# Patient Record
Sex: Female | Born: 1970 | Race: Black or African American | Hispanic: No | Smoking: Never smoker
Health system: Southern US, Community
[De-identification: ages and names within clinical notes are randomized; demographics above are authoritative.]

## PROBLEM LIST (undated history)

## (undated) DIAGNOSIS — F4310 Post-traumatic stress disorder, unspecified: Secondary | ICD-10-CM

## (undated) DIAGNOSIS — F601 Schizoid personality disorder: Secondary | ICD-10-CM

## (undated) DIAGNOSIS — K219 Gastro-esophageal reflux disease without esophagitis: Secondary | ICD-10-CM

## (undated) DIAGNOSIS — D649 Anemia, unspecified: Secondary | ICD-10-CM

## (undated) DIAGNOSIS — I10 Essential (primary) hypertension: Secondary | ICD-10-CM

## (undated) HISTORY — PX: CHOLECYSTECTOMY: SHX55

## (undated) HISTORY — DX: Anemia, unspecified: D64.9

## (undated) HISTORY — PX: ESOPHAGOGASTRODUODENOSCOPY: SHX1529

## (undated) HISTORY — DX: Essential (primary) hypertension: I10

## (undated) HISTORY — DX: Schizoid personality disorder: F60.1

## (undated) HISTORY — DX: Gastro-esophageal reflux disease without esophagitis: K21.9

## (undated) HISTORY — PX: DENTAL SURGERY: SHX609

---

## 2000-10-07 ENCOUNTER — Encounter: Payer: Self-pay | Admitting: Emergency Medicine

## 2000-10-07 ENCOUNTER — Inpatient Hospital Stay (HOSPITAL_COMMUNITY): Admission: EM | Admit: 2000-10-07 | Discharge: 2000-10-08 | Payer: Self-pay | Admitting: Emergency Medicine

## 2000-10-07 ENCOUNTER — Encounter: Payer: Self-pay | Admitting: *Deleted

## 2000-10-08 ENCOUNTER — Encounter: Payer: Self-pay | Admitting: Otolaryngology

## 2000-10-08 ENCOUNTER — Inpatient Hospital Stay (HOSPITAL_COMMUNITY): Admission: EM | Admit: 2000-10-08 | Discharge: 2000-10-10 | Payer: Self-pay | Admitting: Emergency Medicine

## 2000-10-09 ENCOUNTER — Encounter: Payer: Self-pay | Admitting: Otolaryngology

## 2000-10-23 ENCOUNTER — Emergency Department (HOSPITAL_COMMUNITY): Admission: EM | Admit: 2000-10-23 | Discharge: 2000-10-23 | Payer: Self-pay | Admitting: Emergency Medicine

## 2001-06-16 ENCOUNTER — Emergency Department (HOSPITAL_COMMUNITY): Admission: EM | Admit: 2001-06-16 | Discharge: 2001-06-16 | Payer: Self-pay | Admitting: Emergency Medicine

## 2001-06-16 ENCOUNTER — Encounter: Payer: Self-pay | Admitting: Emergency Medicine

## 2001-06-17 ENCOUNTER — Ambulatory Visit (HOSPITAL_COMMUNITY): Admission: RE | Admit: 2001-06-17 | Discharge: 2001-06-17 | Payer: Self-pay | Admitting: Emergency Medicine

## 2001-06-17 ENCOUNTER — Encounter: Payer: Self-pay | Admitting: Emergency Medicine

## 2001-06-18 ENCOUNTER — Emergency Department (HOSPITAL_COMMUNITY): Admission: EM | Admit: 2001-06-18 | Discharge: 2001-06-19 | Payer: Self-pay | Admitting: *Deleted

## 2002-03-14 ENCOUNTER — Encounter: Payer: Self-pay | Admitting: Family Medicine

## 2002-03-14 ENCOUNTER — Ambulatory Visit (HOSPITAL_COMMUNITY): Admission: RE | Admit: 2002-03-14 | Discharge: 2002-03-14 | Payer: Self-pay | Admitting: Family Medicine

## 2002-04-04 ENCOUNTER — Encounter: Payer: Self-pay | Admitting: General Surgery

## 2002-04-04 ENCOUNTER — Encounter (HOSPITAL_COMMUNITY): Admission: RE | Admit: 2002-04-04 | Discharge: 2002-05-04 | Payer: Self-pay | Admitting: General Surgery

## 2002-04-16 ENCOUNTER — Ambulatory Visit (HOSPITAL_COMMUNITY): Admission: RE | Admit: 2002-04-16 | Discharge: 2002-04-16 | Payer: Self-pay | Admitting: General Surgery

## 2003-05-29 ENCOUNTER — Ambulatory Visit (HOSPITAL_COMMUNITY): Admission: RE | Admit: 2003-05-29 | Discharge: 2003-05-29 | Payer: Self-pay | Admitting: Family Medicine

## 2003-10-16 ENCOUNTER — Ambulatory Visit (HOSPITAL_COMMUNITY): Admission: RE | Admit: 2003-10-16 | Discharge: 2003-10-16 | Payer: Self-pay | Admitting: Family Medicine

## 2005-02-10 ENCOUNTER — Emergency Department (HOSPITAL_COMMUNITY): Admission: EM | Admit: 2005-02-10 | Discharge: 2005-02-10 | Payer: Self-pay | Admitting: Emergency Medicine

## 2005-08-30 ENCOUNTER — Emergency Department (HOSPITAL_COMMUNITY): Admission: EM | Admit: 2005-08-30 | Discharge: 2005-08-30 | Payer: Self-pay | Admitting: Emergency Medicine

## 2005-12-13 ENCOUNTER — Emergency Department (HOSPITAL_COMMUNITY): Admission: EM | Admit: 2005-12-13 | Discharge: 2005-12-13 | Payer: Self-pay | Admitting: Emergency Medicine

## 2006-09-12 ENCOUNTER — Emergency Department (HOSPITAL_COMMUNITY): Admission: EM | Admit: 2006-09-12 | Discharge: 2006-09-12 | Payer: Self-pay | Admitting: Emergency Medicine

## 2006-11-08 ENCOUNTER — Ambulatory Visit (HOSPITAL_COMMUNITY): Admission: RE | Admit: 2006-11-08 | Discharge: 2006-11-08 | Payer: Self-pay | Admitting: Internal Medicine

## 2008-11-11 ENCOUNTER — Encounter (INDEPENDENT_AMBULATORY_CARE_PROVIDER_SITE_OTHER): Payer: Self-pay | Admitting: *Deleted

## 2010-08-05 NOTE — Op Note (Signed)
NAME:  Madison Gilbert, Madison Gilbert                        ACCOUNT NO.:  [id number]   MEDICAL RECORD NO.:  [id number]                   PATIENT TYPE:  AMB   LOCATION:  DAY                                  FACILITY:  [facility]   PHYSICIAN:  Dalia Heading, M.D.               DATE OF BIRTH:  May 13, 1970   DATE OF PROCEDURE:  04/16/2002  DATE OF DISCHARGE:                                 OPERATIVE REPORT   PREOPERATIVE DIAGNOSIS:  Chronic cholecystitis.   POSTOPERATIVE DIAGNOSIS:  Chronic cholecystitis.   PROCEDURE:  Laparoscopic cholecystectomy.   SURGEON:  Dalia Heading, M.D.   ASSISTANT:  Bernerd Limbo. Leona Carry, M.D.   ANESTHESIA:  General endotracheal.   INDICATIONS:  The patient is a 40 year old black female who was referred for  evaluation and treatment of chronic cholecystitis.  The risks and benefits  of the procedure, including bleeding, infection, hepatobiliary injury, and  the possibility of an open procedure were fully explained to the patient,  who gave informed consent.   DESCRIPTION OF PROCEDURE:  The patient was placed in the supine position.  After induction of general endotracheal anesthesia, the abdomen was prepped  and draped using the usual sterile technique with Betadine.  Surgical site  confirmation was performed.   A supraumbilical incision was made down to the fascia.  A Veress needle was  introduced into the abdominal cavity and confirmation of placement was done  using the saline drop test.  The abdomen was then insufflated to 16 mmHg  pressure.  An 11-mm trocar was introduced into the abdominal cavity under  direct visualization without difficulty.  The patient was placed in reversed  Trendelenburg position and an additional 11-mm trocar was placed in the  epigastric region and 5-mm trocars were placed in the right upper quadrant  and right flank regions.  The liver was inspected and noted to be within  normal limits.  The gallbladder was retracted superiorly and  laterally.  The  dissection was begun around the infundibulum of the gallbladder.  The cystic  duct was first identified.  Its junction to the infundibulum was well  identified.  Endo clips were placed proximally and distally on the cystic  duct and the cystic duct was divided.  This was likewise done on the cystic  artery.  The gallbladder was then freed away from the gallbladder fossa  using Bovie electrocautery.  The gallbladder was delivered through the  epigastric trocar site without difficulty.  The gallbladder fossa was  inspected and no abnormal bleeding or bile leakage was noted.  Surgicel was  placed on the gallbladder fossa.  The subhepatic space as well as the right  hepatic gutter were evacuated of all fluid and air prior to removal of the  trocars.   All wounds were irrigated with normal saline.  All wounds were injected with  0.5% Sensorcaine.  The supraumbilical fascia was reapproximated using an 0  Vicryl interrupted suture.  All skin incisions were closed using staples.  Betadine ointment and dry sterile dressings were applied.   All instrument and needle counts were correct at the end of the procedure.  The patient was extubated in the operating room and went back to the  recovery room awake and in stable condition.   COMPLICATIONS:  None.   SPECIMEN:  Gallbladder.   BLOOD LOSS:  Minimal.                                                Dalia Heading, M.D.    MAJ/MEDQ  D:  04/16/2002  T:  04/16/2002  Job:  [id number]   cc:   Patrica Duel, M.D.  [address]  [place]  [place] [address]  Fax: [phone]

## 2010-08-05 NOTE — Consult Note (Signed)
[facility]  Patient:    Madison Gilbert, Madison Gilbert                      MRN: [id number] Proc. Date: 10/08/00 Adm. Date:  [id number] Attending:  Fernande Boyden CC:         Gloris Manchester. Lazarus Salines, M.D.  Virl Diamond, D.M.D., M.D.   Consultation Report  DATE OF BIRTH:  [birth year]  REQUESTING PHYSICIAN:  Gloris Manchester. Lazarus Salines, M.D.  Madison Gilbert is a 40 year old female referred by Dr. Flo Shanks for a dental consultation.  Patient presented to [facility] Emergency Room for evaluation of right facial swelling on October 07, 2000.  Patient was then transported to [facility] Emergency Department on October 08, 2000 for evaluation by an ear, nose, and throat physician (Dr. Lazarus Salines) for possible Ludwigs angina.  A CT scan was obtained with no obvious formation noted.  Patient was then placed on clindamycin IV antibiotic therapy and was to be admitted for overnight observation.  Dental consultation was requested for evaluation of the patient and assistance for definitive treatment coordination.  PAST MEDICAL HISTORY: 1. Right submandibular space cellulitis.    a. Current IV antibiotic therapy with Dr. Lazarus Salines. 2. Gross dental caries of the low right molar. 3. History of previous extractions by an oral surgeon in 1991.  ALLERGIES:  NYQUIL, MOTRIN.  MEDICATIONS: 1. Clindamycin 900 mg STAT, then 600 mg IV q.8h. 2. Tylenol 650 mg q.4h. for temperature greater than 101. 3. Tylox one to two q.4h. p.r.n. for severe pain. 4. Tylenol No. 3 one to two q.4h. p.r.n. for pain. 5. Chlorhexidine rinse 0.12% rinsing with 5 ml q.4h.  SOCIAL HISTORY:  Patient is a nonsmoker, nondrinker.  FAMILY HISTORY:  Noncontributory.  FUNCTIONAL ASSESSMENT:  Patient was independent for ADLs prior to this admission.  REVIEW OF SYSTEMS:  This is reviewed from the chart/health history assessment form this admission.  DENTAL HISTORY  CHIEF COMPLAINT:  Patient complained of lower  right facial swelling.  HISTORY OF PRESENT ILLNESS:  Patient with a history of lower right tooth ache which has progressively worsened over the past month.  Patient subsequently developed right submandibular swelling with difficulties swallowing, opening and closing, and chewing.  Patient presented to the [facility] Emergency Room, was transferred to the [facility] Emergency Department for evaluation by ear, nose, and throat physician for possible Ludwigs angina.  CT scan was obtained with no obvious abscess formation noted.  Patient was felt to have a right submandibular space cellulitis secondary to dental caries affecting tooth #31.  Patient complained of dental pain associated with a lower right molar (tooth #31).  Pain is described as being 9.5 out of 10 in pain intensity.  Patient with minimal relief with pain medications and prior to the emergency department Demerol.  Patient does not seek regular dental care by patient report.  DENTAL EXAMINATION  GENERAL:  Patient is a well-developed, well-nourished female in no acute distress.  VITAL SIGNS:  Blood pressure 130/63, pulse 90, respirations 22, temperature 99.2.  HEENT:  Patient has right extraoral submandibular swelling involving the right neck and angle of mandible.  There is no fluctuance noted.  The area is firm to palpation.  The patient is sensitive to palpation.  Patient with some symptoms of trismus and a decreased maximum interincisal opening.  Patient currently denies symptoms of dysphagia or difficulty breathing at this time. Intraoral examination:  The patient has normal saliva.  There is swelling and erythema around the root segments of #31 and impacted tooth #32.  There is no obvious purulence noted around these teeth.  The area is tender to palpation by patient report.  Dentition:  Tooth #31 with gross dental caries most likely involving the pulp.  Dental caries are affecting multiple teeth within the oral  cavity.  I would suggest a full series of dental radiographs and comprehensive dental examination by the private dentist of her choice in the future.  Endodontic:  There is a history of acute pulpitis symptoms of tooth #31.  There is a periapical radiolucency noted at the apices of tooth #31. There is a radiolucency also noted around the coronal portion of tooth #32.  ASSESSMENT: 1. Right submandibular space cellulitis. 2. Acute apical periodontitis of tooth #31. 3. Pericoronitis of the impacted tooth #32. 4. Acute, irreversible pulpitis symptoms of tooth #32. 5. Multiple dental caries with a need for followup comprehensive dental care    and full series of dental radiographs.  PLAN/RECOMMENDATIONS: 1. I discussed the risks, benefits, and complications of various treatment    options with the patient in relationship to her medical and dental    conditions. We discussed continuation of antibiotic therapy and the need to    take oral medications until they are all gone once they are prescribed.  We    discussed followup with the oral surgeon for extraction of tooth #31 and 32    due to the complexity of the extractions.  We discussed followup dental    radiographs and examination and treatment plan by the private dentist of    her choice in the future. 2. Discussion of findings with Dr. Lazarus Salines as indicated. 3. Discussion of findings with Dr. Rhea Pink (oral surgeon on-call) for an    outpatient appointment on Thursday.  Patient to present with n.p.o. status    and a driver to drive her home in the event the oral surgeon is able to    extract the teeth at that time. 4. Continue chlorhexidine rinses 0.12% rinsing with 10 ml t.i.d. DD:  10/09/00 TD:  10/09/00 Job: [id number]

## 2010-08-05 NOTE — H&P (Signed)
   NAME:  Madison Gilbert, Madison Gilbert                        ACCOUNT NO.:  [id number]   MEDICAL RECORD NO.:  [id number]                   PATIENT TYPE:   LOCATION:                                       FACILITY:  [facility]   PHYSICIAN:  Dalia Heading, M.D.               DATE OF BIRTH:  02-18-1971   DATE OF ADMISSION:  04/16/2002  DATE OF DISCHARGE:                                HISTORY & PHYSICAL   CHIEF COMPLAINT:  Biliary colic.   HISTORY OF PRESENT ILLNESS:  The patient is a 40 year old black female who  is referred for evaluation and treatment of right upper quadrant abdominal  pain.  She has been having upper abdominal pain with radiation into the  right flank, nausea, bloating, and fatty food intolerance for over 1 year.  No fever, chills, jaundice, or history of peptic ulcer disease have been  noted.   PAST MEDICAL HISTORY:  Extrinsic allergies.   PAST SURGICAL HISTORY:  Tubal ligation.   CURRENT MEDICATIONS:  Ibuprofen, Allegra.   ALLERGIES:  No known drug allergies.   REVIEW OF SYSTEMS:  Noncontributory.   PHYSICAL EXAMINATION:  GENERAL:  On physical examination, the patient is a  well-developed well-nourished black female in no acute distress.  VITAL SIGNS:  She is afebrile and vital signs are stable.  HEENT:  No scleral icterus.  LUNGS:  Clear to auscultation with equal breath sounds bilaterally.  HEART:  Regular rate and rhythm without S3, S4, or murmurs.  ABDOMEN:  Soft with tenderness noted in the right upper quadrant.  No  hepatosplenomegaly, masses, or hernias are identified.   DIAGNOSTIC STUDIES:  Ultrasound of the gallbladder is negative in March  2003.  An upper GI series was negative for peptic ulcer disease in December  2003.  Her hepatobiliary scan showed normal gallbladder ejection fraction  but she did have reproducible symptoms with a fatty meal.   IMPRESSION:  Biliary colic secondary to chronic cholecystitis.    PLAN:  The patient is scheduled for  laparoscopic cholecystectomy on  04/16/02.  The risks and benefits of the procedure including bleeding,  infection, hepatobiliary injury, the possibility of an open procedure, and  the possibility of recurrence of the symptoms were fully explained to the  patient, who gave informed consent.                                               Dalia Heading, M.D.    MAJ/MEDQ  D:  04/08/2002  T:  04/08/2002  Job:  [id number]   cc:   Patrica Duel, M.D.  [address]  [place]  [place] [address]  Fax: [phone]

## 2011-02-22 ENCOUNTER — Encounter: Payer: Self-pay | Admitting: Internal Medicine

## 2011-03-06 ENCOUNTER — Ambulatory Visit: Payer: Self-pay | Admitting: Gastroenterology

## 2011-03-21 HISTORY — PX: COLONOSCOPY: SHX174

## 2011-09-28 ENCOUNTER — Other Ambulatory Visit (HOSPITAL_COMMUNITY): Payer: Self-pay | Admitting: Internal Medicine

## 2011-09-28 DIAGNOSIS — Z139 Encounter for screening, unspecified: Secondary | ICD-10-CM

## 2011-09-28 DIAGNOSIS — Z01419 Encounter for gynecological examination (general) (routine) without abnormal findings: Secondary | ICD-10-CM

## 2011-10-02 ENCOUNTER — Ambulatory Visit (HOSPITAL_COMMUNITY)
Admission: RE | Admit: 2011-10-02 | Discharge: 2011-10-02 | Disposition: A | Discharge status: Home / Self Care | Payer: Medicaid Other | Source: Ambulatory Visit | Attending: Internal Medicine | Admitting: Internal Medicine

## 2011-10-02 DIAGNOSIS — Z139 Encounter for screening, unspecified: Secondary | ICD-10-CM

## 2011-10-02 DIAGNOSIS — Z01419 Encounter for gynecological examination (general) (routine) without abnormal findings: Secondary | ICD-10-CM

## 2011-10-02 DIAGNOSIS — Z1231 Encounter for screening mammogram for malignant neoplasm of breast: Secondary | ICD-10-CM | POA: Insufficient documentation

## 2011-10-03 ENCOUNTER — Telehealth: Payer: Self-pay

## 2011-10-03 NOTE — Telephone Encounter (Signed)
Received referral from Dr. Regino Schultze for pt to have a colonoscopy for rectal bleeding. I called the pt to schedule OV first. She asked for Dr. Karilyn Cota, and said that he has her records, and that is where she told them to send her referral to. I told her I will fax [facility] back a note and she is going to call them also. She was also given Dr.Rehman's phone number.

## 2011-10-03 NOTE — Telephone Encounter (Signed)
Agree 

## 2011-10-11 ENCOUNTER — Encounter (INDEPENDENT_AMBULATORY_CARE_PROVIDER_SITE_OTHER): Payer: Self-pay | Admitting: *Deleted

## 2011-10-19 ENCOUNTER — Ambulatory Visit (INDEPENDENT_AMBULATORY_CARE_PROVIDER_SITE_OTHER): Payer: Medicaid Other | Admitting: Internal Medicine

## 2011-10-19 ENCOUNTER — Encounter (INDEPENDENT_AMBULATORY_CARE_PROVIDER_SITE_OTHER): Payer: Self-pay | Admitting: Internal Medicine

## 2011-10-19 VITALS — BP 130/70 | HR 80 | Temp 98.4°F | Ht 63.0 in | Wt 218.4 lb

## 2011-10-19 DIAGNOSIS — I10 Essential (primary) hypertension: Secondary | ICD-10-CM | POA: Insufficient documentation

## 2011-10-19 DIAGNOSIS — D649 Anemia, unspecified: Secondary | ICD-10-CM | POA: Insufficient documentation

## 2011-10-19 LAB — FERRITIN: Ferritin: 19 ng/mL (ref 10–291)

## 2011-10-19 LAB — IRON AND TIBC: %SAT: 15 % — ABNORMAL LOW (ref 20–55)

## 2011-10-19 NOTE — Progress Notes (Addendum)
Subjective:     Patient ID: Madison Gilbert, female   DOB: 1971/01/02, 41 y.o.   MRN: [id number]  HPI Referred to our office by [facility] for anemia. Noted on 09/28/2011 H nad H 8.0 and 26.5.  MCV 68.8.  She declined to be transfused per [name], PAC.  She also says she has been having bloody stools for years. Symptoms have progressively worsened. She is having bloody stools 2-3 times a day every day. She says its like she is having a period. She started the Iron on September 28, 2011. Blood is dark.  No unintentional weight loss. She c/o epigastric pain.  She says when she passes flatus she will also pass blood. She has a hx of anemia and was actually seen in our [facility] for anemia. Colonoscopy was scheduled but she did not follow thru.  No change in stool size.  LMP:  September 24, 2011 and lasted thru the 23 of July. Very heavy.  Uses 28 pads in 2 weeks and also 36 tampons. She is not sexually active  Review of Systems see hpi Current Outpatient Prescriptions  Medication Sig Dispense Refill  . ALPRAZolam (XANAX) 0.5 MG tablet Take 0.5 mg by mouth at bedtime as needed.      Marland Kitchen amLODipine (NORVASC) 5 MG tablet Take 5 mg by mouth daily.      . ergocalciferol (VITAMIN D2) 50000 UNITS capsule Take 50,000 Units by mouth once a week.      . ferrous sulfate 325 (65 FE) MG tablet Take 325 mg by mouth daily with breakfast.      . traMADol (ULTRAM) 50 MG tablet Take 50 mg by mouth every 6 (six) hours as needed.      . vitamin E 400 UNIT capsule Take 400 Units by mouth daily.          History   Social History  . Marital Status: Widowed    Spouse Name: N/A    Number of Children: N/A  . Years of Education: N/A   Occupational History  . Not on file.   Social History Main Topics  . Smoking status: Never Smoker   . Smokeless tobacco: Not on file  . Alcohol Use: No  . Drug Use: No  . Sexually Active: Not on file   Other Topics Concern  . Not on file   Social History Narrative  . No  narrative on file   Family Status  Relation Status Death Age  . Mother Alive     DM, HTN, COPD, DJD back, CAD  . Father Deceased     suicide  . Sister Alive     DM, One needs a heart transplant  . Brother Alive     good health   Past Surgical History  Procedure Date  . Cholecystectomy     for bloody stools   Allergies  Allergen Reactions  . Hydrocodone   . Lisinopril    Family Status  Relation Status Death Age  . Mother Alive     DM, HTN, COPD, DJD back, CAD  . Father Deceased     suicide  . Sister Alive     DM, One needs a heart transplant  . Brother Alive     good health           Objective:   Physical Exam Filed Vitals:   10/19/11 1106  Height: 5\' 3"  (1.6 m)  Weight: 218 lb 6.4 oz (99.066 kg)  Alert and oriented.  Skin warm and dry. Oral mucosa is moist.   . Sclera anicteric, conjunctivae is pink. Thyroid not enlarged. No cervical lymphadenopathy. Lungs clear. Heart regular rate and rhythm.  Abdomen is soft. Bowel sounds are positive. No hepatomegaly. No abdominal masses felt. No tenderness.  No edema to lower extremities.        Assessment:   Anemia. Rectal bleeding. Colonic neoplasm needs to be ruled.  If normal EGD to rule PUD.  Patient gives hx of heavy menstrual cycle.    Plan:   Colonoscopy if normal EGD.The risks and benefits such as perforation, bleeding, and infection were reviewed with the patient and is agreeable.  Iron studies.

## 2011-10-19 NOTE — Patient Instructions (Addendum)
Colonoscopy, if normal EGD. Iron studies.

## 2011-10-23 ENCOUNTER — Other Ambulatory Visit (INDEPENDENT_AMBULATORY_CARE_PROVIDER_SITE_OTHER): Payer: Self-pay | Admitting: *Deleted

## 2011-10-23 ENCOUNTER — Telehealth (INDEPENDENT_AMBULATORY_CARE_PROVIDER_SITE_OTHER): Payer: Self-pay | Admitting: *Deleted

## 2011-10-23 DIAGNOSIS — K625 Hemorrhage of anus and rectum: Secondary | ICD-10-CM

## 2011-10-23 DIAGNOSIS — D649 Anemia, unspecified: Secondary | ICD-10-CM

## 2011-10-23 DIAGNOSIS — Z1211 Encounter for screening for malignant neoplasm of colon: Secondary | ICD-10-CM

## 2011-10-23 MED ORDER — BISACODYL-PEG-KCL-NABICAR-NACL 5-210 MG-GM PO KIT: 1.0000 | PACK | Freq: Once | ORAL | Status: DC

## 2011-10-23 NOTE — Telephone Encounter (Signed)
Patient needs half lytely 

## 2011-10-24 ENCOUNTER — Encounter (INDEPENDENT_AMBULATORY_CARE_PROVIDER_SITE_OTHER): Payer: Self-pay | Admitting: *Deleted

## 2011-11-01 ENCOUNTER — Encounter (HOSPITAL_COMMUNITY): Payer: Self-pay | Admitting: Pharmacy Technician

## 2011-11-17 ENCOUNTER — Encounter (HOSPITAL_COMMUNITY): Payer: Self-pay

## 2011-11-17 ENCOUNTER — Encounter (HOSPITAL_COMMUNITY)
Admission: RE | Disposition: A | Discharge status: Home / Self Care | Payer: Self-pay | Source: Ambulatory Visit | Attending: Internal Medicine

## 2011-11-17 ENCOUNTER — Ambulatory Visit (HOSPITAL_COMMUNITY)
Admission: RE | Admit: 2011-11-17 | Discharge: 2011-11-17 | Disposition: A | Discharge status: Home / Self Care | Payer: Medicaid Other | Source: Ambulatory Visit | Attending: Internal Medicine | Admitting: Internal Medicine

## 2011-11-17 DIAGNOSIS — K625 Hemorrhage of anus and rectum: Secondary | ICD-10-CM

## 2011-11-17 DIAGNOSIS — I10 Essential (primary) hypertension: Secondary | ICD-10-CM | POA: Insufficient documentation

## 2011-11-17 DIAGNOSIS — D126 Benign neoplasm of colon, unspecified: Secondary | ICD-10-CM

## 2011-11-17 DIAGNOSIS — Z79899 Other long term (current) drug therapy: Secondary | ICD-10-CM | POA: Insufficient documentation

## 2011-11-17 DIAGNOSIS — R109 Unspecified abdominal pain: Secondary | ICD-10-CM

## 2011-11-17 DIAGNOSIS — D509 Iron deficiency anemia, unspecified: Secondary | ICD-10-CM | POA: Insufficient documentation

## 2011-11-17 DIAGNOSIS — K644 Residual hemorrhoidal skin tags: Secondary | ICD-10-CM

## 2011-11-17 DIAGNOSIS — D649 Anemia, unspecified: Secondary | ICD-10-CM

## 2011-11-17 DIAGNOSIS — D131 Benign neoplasm of stomach: Secondary | ICD-10-CM

## 2011-11-17 SURGERY — COLONOSCOPY WITH ESOPHAGOGASTRODUODENOSCOPY (EGD)
Anesthesia: Moderate Sedation

## 2011-11-17 MED ORDER — MEPERIDINE HCL 50 MG/ML IJ SOLN
INTRAMUSCULAR | Status: DC | PRN
  Administered 2011-11-17 (×2): 25 mg via INTRAVENOUS

## 2011-11-17 MED ORDER — MIDAZOLAM HCL 5 MG/5ML IJ SOLN
INTRAMUSCULAR | Status: DC | PRN
  Administered 2011-11-17 (×3): 2 mg via INTRAVENOUS

## 2011-11-17 MED ORDER — MIDAZOLAM HCL 5 MG/5ML IJ SOLN
INTRAMUSCULAR | Status: AC
  Filled 2011-11-17: qty 10

## 2011-11-17 MED ORDER — HYOSCYAMINE SULFATE 0.125 MG SL SUBL: 0.1250 mg | SUBLINGUAL_TABLET | Freq: Four times a day (QID) | SUBLINGUAL | Status: DC | PRN

## 2011-11-17 MED ORDER — MEPERIDINE HCL 50 MG/ML IJ SOLN
INTRAMUSCULAR | Status: AC
  Filled 2011-11-17: qty 1

## 2011-11-17 MED ORDER — STERILE WATER FOR IRRIGATION IR SOLN
Status: DC | PRN
  Administered 2011-11-17: 11:00:00

## 2011-11-17 MED ORDER — SODIUM CHLORIDE 0.45 % IV SOLN
Freq: Once | INTRAVENOUS | Status: AC
  Administered 2011-11-17: 10:00:00 via INTRAVENOUS

## 2011-11-17 MED ORDER — HYDROCORTISONE ACETATE 25 MG RE SUPP: 25.0000 mg | Freq: Every day | RECTAL | Status: AC

## 2011-11-17 MED ORDER — BUTAMBEN-TETRACAINE-BENZOCAINE 2-2-14 % EX AERO
INHALATION_SPRAY | CUTANEOUS | Status: DC | PRN
  Administered 2011-11-17: 2 via TOPICAL

## 2011-11-17 NOTE — Op Note (Signed)
EGD PROCEDURE REPORT  PATIENT:  Madison Gilbert  MR#:  [id number] Birthdate:  April 21, 1970, 41 y.o., female Endoscopist:  Dr. Malissa Hippo, MD Referred By:  Madison Mend, PA Procedure Date: 11/17/2011  Procedure:   EGD & Colonoscopy  Indications:  Patient is 41 year old African female with a recurrent/chronic abdominal pain, rectal bleeding and iron deficiency anemia.            Informed Consent:  The risks, benefits, alternatives & imponderables which include, but are not limited to, bleeding, infection, perforation, drug reaction and potential missed lesion have been reviewed.  The potential for biopsy, lesion removal, esophageal dilation, etc. have also been discussed.  Questions have been answered.  All parties agreeable.  Please see history & physical in medical record for more information.  Medications:  Demerol 50 mg IV Versed 6 mg IV Cetacaine spray topically for oropharyngeal anesthesia  EGD  Description of procedure:  The endoscope was introduced through the mouth and advanced to the second portion of the duodenum without difficulty or limitations. The mucosal surfaces were surveyed very carefully during advancement of the scope and upon withdrawal.  Findings:  Esophagus:  Normal mucosa throughout. GEJ:  38 cm Stomach:  Stomach was empty and distended very well with insufflation. Folds in the proximal stomach were normal. Examination of mucosa at gastric body, antrum, pyloric channel, fundus, cardia was normal. Duodenum:  Normal bulbar mucosa. 10 mm submucosal lesion involving second part of the duodenum either a lipoma or leiomyoma. It was palpated with closed biopsy forceps it did not appear to be a cyst.  Therapeutic/Diagnostic Maneuvers Performed:  None  COLONOSCOPY Description of procedure:  After a digital rectal exam was performed, that colonoscope was advanced from the anus through the rectum and colon to the area of the cecum, ileocecal valve and appendiceal  orifice. The cecum was deeply intubated. These structures were well-seen and photographed for the record. From the level of the cecum and ileocecal valve, the scope was slowly and cautiously withdrawn. The mucosal surfaces were carefully surveyed utilizing scope tip to flexion to facilitate fold flattening as needed. The scope was pulled down into the rectum where a thorough exam including retroflexion was performed. Terminal ileum was also examined.  Findings:  Prep satisfactory.  Normal terminal ileum. 4 mm polyp ablated via cold biopsy from sigmoid colon. Normal rectal mucosa. Large hemorrhoids with erythema below the dentate line.  Therapeutic/Diagnostic Maneuvers Performed:  See above  Complications:  None  Cecal Withdrawal Time:  11 minutes  Impression:  10 mm submucosal lesion involving second part of the duodenum otherwise normal EGD. This lesion is either a lipoma or leiomyoma. Normal terminal ileum. 4 mm  polyp ablated via cold biopsy from sigmoid colon. Prominent external hemorrhoids with inflammation felt to be source of patient's chronic rectal bleeding.  Recommendations:  Resume iron pills as before. High-fiber diet. Anusol-HC suppository 1 per rectum daily at bedtime for two weeks. Avocet S. L1 4 times a day when necessary. I will contact patient with results of biopsy. Office visit in 2 months. Repeat EGD in one year to document stability of duodenal submucosal lesion.   Madison Gilbert  11/17/2011 11:57 AM  CC: Dr. Gracelyn Nurse, PA & Dr. No ref. provider found

## 2011-11-17 NOTE — H&P (Signed)
Madison Gilbert is an 41 y.o. female.   Chief Complaint: Patient is here for EGD and colonoscopy. HPI: This 41 year old African female who presents with chronic abdominal pain intermittent rectal bleeding. She said rectal bleeding for 10 years. At times she feels as if she is having.. She also gives history of heavy periods. She was begun on Provera by Dr. Ralph Dowdy but so far it has not helped. She also complains of frequent abdominal pain. She states she had cholecystectomy in January 2004 her pain has continued. She gives history of peptic ulcer disease. She does not take any NSAIDs. She denies chronic diarrhea and constipation. Her abdominal pain starts around cholecystectomy scar and radiates inferiorly. She had iron studies confirmed iron deficiency anemia.  Past Medical History  Diagnosis Date  . Anemia   . Hypertension     Past Surgical History  Procedure Date  . Cholecystectomy     for bloody stools    History reviewed. No pertinent family history. Social History:  reports that she has never smoked. She does not have any smokeless tobacco history on file. She reports that she does not drink alcohol or use illicit drugs.  Allergies:  Allergies  Allergen Reactions  . Hydrocodone Nausea Only  . Lisinopril Other (See Comments)    Dizziness     Medications Prior to Admission  Medication Sig Dispense Refill  . acetaminophen (TYLENOL) 500 MG tablet Take 1,000 mg by mouth every 6 (six) hours as needed. Pain      . ALPRAZolam (XANAX) 0.5 MG tablet Take 0.5 mg by mouth at bedtime as needed.      Marland Kitchen amLODipine (NORVASC) 5 MG tablet Take 5 mg by mouth daily.      . Bisacodyl-PEG-KCl-NaBicar-NaCl (HALFLYTELY WITH FLAVOR PACKS) 5-210 MG-GM kit Take 1 kit by mouth once.  1 each  0  . Brompheniramine-Phenylephrine (COLD & ALLERGY PO) Take 1 tablet by mouth daily as needed. Allergies      . diphenhydrAMINE (BENADRYL) 25 MG tablet Take 25 mg by mouth every 6 (six) hours as needed. Allergies       . ergocalciferol (VITAMIN D2) 50000 UNITS capsule Take 50,000 Units by mouth once a week. Takes on Thursdays      . traMADol (ULTRAM) 50 MG tablet Take 50 mg by mouth every 6 (six) hours as needed. Pain      . ferrous sulfate 325 (65 FE) MG tablet Take 325 mg by mouth daily with breakfast.      . medroxyPROGESTERone (PROVERA) 5 MG tablet Take 5 mg by mouth daily.        No results found for this or any previous visit (from the past 48 hour(s)). No results found.  ROS  Blood pressure 148/93, pulse 80, temperature 98.1 F (36.7 C), temperature source Oral, resp. rate 22, height 5\' 3"  (1.6 m), weight 219 lb (99.338 kg), SpO2 98.00%. Physical Exam  Constitutional: She appears well-developed and well-nourished.  HENT:  Mouth/Throat: Oropharynx is clear and moist.  Eyes: Conjunctivae are normal.  Neck: No thyromegaly present.  Cardiovascular: Normal rate, regular rhythm and normal heart sounds.   No murmur heard. Respiratory: Effort normal and breath sounds normal.  GI: Soft. She exhibits no distension and no mass. There is no tenderness.  Musculoskeletal: She exhibits no edema.  Lymphadenopathy:    She has no cervical adenopathy.  Neurological: She is alert.  Skin: Skin is warm and dry.     Assessment/Plan Chronic abdominal pain. Iron deficiency anemia. Rectal  bleeding. EGD and colonoscopy.  Yoskar Murrillo U 11/17/2011, 11:07 AM

## 2011-11-24 ENCOUNTER — Encounter (INDEPENDENT_AMBULATORY_CARE_PROVIDER_SITE_OTHER): Payer: Self-pay

## 2011-11-28 ENCOUNTER — Encounter (INDEPENDENT_AMBULATORY_CARE_PROVIDER_SITE_OTHER): Payer: Self-pay | Admitting: *Deleted

## 2012-01-22 ENCOUNTER — Ambulatory Visit (INDEPENDENT_AMBULATORY_CARE_PROVIDER_SITE_OTHER): Payer: Medicaid Other | Admitting: Internal Medicine

## 2012-01-30 ENCOUNTER — Ambulatory Visit (INDEPENDENT_AMBULATORY_CARE_PROVIDER_SITE_OTHER): Payer: Medicaid Other | Admitting: Internal Medicine

## 2012-11-11 ENCOUNTER — Encounter (INDEPENDENT_AMBULATORY_CARE_PROVIDER_SITE_OTHER): Payer: Self-pay | Admitting: *Deleted

## 2016-03-20 HISTORY — PX: ABDOMINAL HYSTERECTOMY: SHX81

## 2017-07-23 ENCOUNTER — Emergency Department (HOSPITAL_COMMUNITY): Admission: EM | Admit: 2017-07-23 | Discharge: 2017-07-23 | Discharge status: Against Medical Advice | Payer: Self-pay

## 2017-07-23 NOTE — ED Notes (Signed)
Called pt to triage x1

## 2017-07-23 NOTE — ED Notes (Signed)
Called no answer

## 2017-07-30 ENCOUNTER — Encounter (HOSPITAL_COMMUNITY): Payer: Self-pay | Admitting: Emergency Medicine

## 2017-07-30 ENCOUNTER — Emergency Department (HOSPITAL_COMMUNITY): Payer: Self-pay

## 2017-07-30 ENCOUNTER — Emergency Department (HOSPITAL_COMMUNITY)
Admission: EM | Admit: 2017-07-30 | Discharge: 2017-07-30 | Disposition: A | Discharge status: Home / Self Care | Payer: Self-pay | Attending: Emergency Medicine | Admitting: Emergency Medicine

## 2017-07-30 DIAGNOSIS — K2100 Gastro-esophageal reflux disease with esophagitis, without bleeding: Secondary | ICD-10-CM

## 2017-07-30 DIAGNOSIS — Z79899 Other long term (current) drug therapy: Secondary | ICD-10-CM | POA: Insufficient documentation

## 2017-07-30 DIAGNOSIS — R079 Chest pain, unspecified: Secondary | ICD-10-CM | POA: Insufficient documentation

## 2017-07-30 DIAGNOSIS — I10 Essential (primary) hypertension: Secondary | ICD-10-CM | POA: Insufficient documentation

## 2017-07-30 DIAGNOSIS — K0889 Other specified disorders of teeth and supporting structures: Secondary | ICD-10-CM | POA: Insufficient documentation

## 2017-07-30 HISTORY — DX: Post-traumatic stress disorder, unspecified: F43.10

## 2017-07-30 LAB — BASIC METABOLIC PANEL
Anion gap: 8 (ref 5–15)
BUN: 10 mg/dL (ref 6–20)
CALCIUM: 9 mg/dL (ref 8.9–10.3)
CO2: 25 mmol/L (ref 22–32)
CREATININE: 1.13 mg/dL — AB (ref 0.44–1.00)
Chloride: 104 mmol/L (ref 101–111)
GFR calc Af Amer: >60 mL/min (ref >60)
GFR calc non Af Amer: 57 mL/min — ABNORMAL LOW (ref >60)
GLUCOSE: 93 mg/dL (ref 65–99)
Potassium: 3.7 mmol/L (ref 3.5–5.1)
SODIUM: 137 mmol/L (ref 135–145)

## 2017-07-30 LAB — CBC
HCT: 37.6 % (ref 36.0–46.0)
HEMOGLOBIN: 11.7 g/dL — AB (ref 12.0–15.0)
MCH: 26.2 pg (ref 26.0–34.0)
MCHC: 31.1 g/dL (ref 30.0–36.0)
MCV: 84.1 fL (ref 78.0–100.0)
Platelets: 184 10*3/uL (ref 150–400)
RBC: 4.47 MIL/uL (ref 3.87–5.11)
RDW: 13.4 % (ref 11.5–15.5)
WBC: 4.7 10*3/uL (ref 4.0–10.5)

## 2017-07-30 LAB — TROPONIN I: TROPONIN I: 0.04 ng/mL — AB (ref <0.03)

## 2017-07-30 MED ORDER — TRAMADOL HCL 50 MG PO TABS: 50.0000 mg | ORAL_TABLET | Freq: Four times a day (QID) | ORAL | 0 refills | Status: DC | PRN

## 2017-07-30 MED ORDER — OMEPRAZOLE 20 MG PO CPDR: 20.0000 mg | DELAYED_RELEASE_CAPSULE | Freq: Two times a day (BID) | ORAL | 1 refills | Status: DC

## 2017-07-30 MED ORDER — AMOXICILLIN 250 MG PO CAPS
500.0000 mg | ORAL_CAPSULE | Freq: Once | ORAL | Status: AC
  Administered 2017-07-30: 500 mg via ORAL
  Filled 2017-07-30: qty 2

## 2017-07-30 MED ORDER — PANTOPRAZOLE SODIUM 40 MG PO TBEC
40.0000 mg | DELAYED_RELEASE_TABLET | Freq: Once | ORAL | Status: AC
  Administered 2017-07-30: 40 mg via ORAL
  Filled 2017-07-30: qty 1

## 2017-07-30 MED ORDER — AMOXICILLIN 500 MG PO CAPS: 500.0000 mg | ORAL_CAPSULE | Freq: Three times a day (TID) | ORAL | 0 refills | Status: DC

## 2017-07-30 MED ORDER — TRAMADOL HCL 50 MG PO TABS
50.0000 mg | ORAL_TABLET | Freq: Once | ORAL | Status: AC
  Administered 2017-07-30: 50 mg via ORAL
  Filled 2017-07-30: qty 1

## 2017-07-30 MED ORDER — AMLODIPINE BESYLATE 10 MG PO TABS: 10.0000 mg | ORAL_TABLET | Freq: Every day | ORAL | 0 refills | Status: DC

## 2017-07-30 NOTE — ED Provider Notes (Signed)
[facility] EMERGENCY DEPARTMENT Provider Note   CSN: [id number] Arrival date & time: 07/30/17  1252     History   Chief Complaint Chief Complaint  Patient presents with  . Chest Pain    HPI Madison Gilbert is a 47 y.o. female.  Chief complaint is tooth pain, chest pain, recent spider bite, homeless, out of medications.  HPI: As above, patient has multiple complaints.  States she is homeless.  She and her fianc been staying in hotels.  They are now staying with her fianc's parents.  Has been out of her amlodipine blood pressure medicine for a few months.  Was bitten by her arm a few days ago by what she believes was a brown recluse.  States it "got all swollen".  It is better now.  Also states she has had some heartburn and has discomfort in her chest when she lays flat.  Has a painful left lower tooth for months that is been worse for the last 2 days.  Has been taking Tylenol.  Does not take ibuprofen because it "tears up my stomach".  However has been taking Aleve.  Past Medical History:  Diagnosis Date  . Anemia   . Hypertension   . PTSD (post-traumatic stress disorder)     Patient Active Problem List   Diagnosis Date Noted  . Anemia 10/19/2011  . Hypertension 10/19/2011    Past Surgical History:  Procedure Laterality Date  . ABDOMINAL HYSTERECTOMY    . CHOLECYSTECTOMY     for bloody stools     OB History   None      Home Medications    Prior to Admission medications   Medication Sig Start Date End Date Taking? Authorizing Provider  acetaminophen (TYLENOL) 500 MG tablet Take 1,000 mg by mouth every 6 (six) hours as needed. Pain   Yes [provider]  amLODipine (NORVASC) 5 MG tablet Take 5 mg by mouth daily.   Yes [provider]  diazepam (VALIUM) 5 MG tablet Take 5 mg by mouth 3 (three) times daily. 05/08/17  Yes [provider]  diphenhydrAMINE (BENADRYL) 25 MG tablet Take 25 mg by mouth daily. Allergies    Yes [provider]  naproxen sodium (ALEVE) 220 MG tablet Take 440 mg by mouth daily as needed.   Yes [provider]  amoxicillin (AMOXIL) 500 MG capsule Take 1 capsule (500 mg total) by mouth 3 (three) times daily. 07/30/17   Tanna Furry, MD  omeprazole (PRILOSEC) 20 MG capsule Take 1 capsule (20 mg total) by mouth 2 (two) times daily. 07/30/17   Tanna Furry, MD  traMADol (ULTRAM) 50 MG tablet Take 1 tablet (50 mg total) by mouth every 6 (six) hours as needed. 07/30/17   Tanna Furry, MD    Family History History reviewed. No pertinent family history.  Social History Social History   Tobacco Use  . Smoking status: Never Smoker  . Smokeless tobacco: Never Used  Substance Use Topics  . Alcohol use: No  . Drug use: No     Allergies   Grass extracts [gramineae pollens]; Hydrocodone; Lisinopril; and Tomato   Review of Systems Review of Systems  Constitutional: Negative for appetite change, chills, diaphoresis, fatigue and fever.  HENT: Positive for dental problem. Negative for mouth sores, sore throat and trouble swallowing.   Eyes: Negative for visual disturbance.  Respiratory: Negative for cough, chest tightness, shortness of breath and wheezing.   Cardiovascular: Positive for chest pain.  Gastrointestinal: Negative  for abdominal distention, abdominal pain, diarrhea, nausea and vomiting.  Endocrine: Negative for polydipsia, polyphagia and polyuria.  Genitourinary: Negative for dysuria, frequency and hematuria.  Musculoskeletal: Negative for gait problem.       Left arm pain from spider bite  Skin: Negative for color change, pallor and rash.  Neurological: Negative for dizziness, syncope, light-headedness and headaches.  Hematological: Does not bruise/bleed easily.  Psychiatric/Behavioral: Negative for behavioral problems and confusion.     Physical Exam Updated Vital Signs BP (!) 204/117 (BP Location: Right Arm)   Pulse 95   Temp 98.5 F (36.9 C) (Oral)   Resp 20    Ht 5\' 3"  (1.6 m)   Wt 98 kg (216 lb)   LMP 09/24/2011   SpO2 96%   BMI 38.26 kg/m   Physical Exam  Constitutional: She is oriented to person, place, and time. She appears well-developed and well-nourished. No distress.  HENT:  Head: Normocephalic.  Multiple fractured carious teeth.  Her left second molar has a cavity.  There is no periodontal swelling.  It is painful to touch.  The first molar, and first premolar are fractured and carious.  No gingival swelling.  No induration to the neck.  Eyes: Pupils are equal, round, and reactive to light. Conjunctivae are normal. No scleral icterus.  Neck: Normal range of motion. Neck supple. No thyromegaly present.  Cardiovascular: Normal rate and regular rhythm. Exam reveals no gallop and no friction rub.  No murmur heard. Pulmonary/Chest: Effort normal and breath sounds normal. No respiratory distress. She has no wheezes. She has no rales.  Clear bilateral breath sounds.  Large lipoma left posterior chest.  Abdominal: Soft. Bowel sounds are normal. She exhibits no distension. There is no tenderness. There is no rebound.  Musculoskeletal: Normal range of motion.  Left arm appears normal.  No necrotic area erythema or swelling to suggest brown recluse bite  Neurological: She is alert and oriented to person, place, and time.  Skin: Skin is warm and dry. No rash noted.  Psychiatric: She has a normal mood and affect. Her behavior is normal.     ED Treatments / Results  Labs (all labs ordered are listed, but only abnormal results are displayed) Labs Reviewed  BASIC METABOLIC PANEL - Abnormal; Notable for the following components:      Result Value   Creatinine, Ser 1.13 (*)    GFR calc non Af Amer 57 (*)    All other components within normal limits  CBC - Abnormal; Notable for the following components:   Hemoglobin 11.7 (*)    All other components within normal limits  TROPONIN I - Abnormal; Notable for the following components:   Troponin I  0.04 (*)    All other components within normal limits    EKG EKG Interpretation  Date/Time:  Monday Jul 30 2017 12:58:05 EDT Ventricular Rate:  97 PR Interval:  150 QRS Duration: 72 QT Interval:  332 QTC Calculation: 421 R Axis:   62 Text Interpretation:  Normal sinus rhythm Normal ECG Confirmed by Tanna Furry [phone]) on 07/30/2017 1:41:36 PM   Radiology Dg Chest 2 View  Result Date: 07/30/2017 CLINICAL DATA:  Chest pain/pressure with shortness of breath EXAM: CHEST - 2 VIEW COMPARISON:  May 09, 2017 FINDINGS: The lungs are clear. The heart size and pulmonary vascularity are normal. No adenopathy. No pneumothorax. No bone lesions. IMPRESSION: No edema or consolidation. Electronically Signed   By: Lowella Grip III M.D.   On: 07/30/2017 13:50  Procedures Procedures (including critical care time)  Medications Ordered in ED Medications  traMADol (ULTRAM) tablet 50 mg (has no administration in time range)  amoxicillin (AMOXIL) capsule 500 mg (has no administration in time range)  pantoprazole (PROTONIX) EC tablet 40 mg (has no administration in time range)     Initial Impression / Assessment and Plan / ED Course  I have reviewed the triage vital signs and the nursing notes.  Pertinent labs & imaging results that were available during my care of the patient were reviewed by me and considered in my medical decision making (see chart for details).    EKG Interpretation  Date/Time:  Monday Jul 30 2017 12:58:05 EDT Ventricular Rate:  97 PR Interval:  150 QRS Duration: 72 QT Interval:  332 QTC Calculation: 421 R Axis:   62 Text Interpretation:  Normal sinus rhythm Normal ECG Confirmed by Tanna Furry [phone]) on 07/30/2017 1:41:36 PM   Normal chest x-ray.  Reassuring labs.  Troponin 0.0 2:04 weeks of continuous chest pain not consistent with myocardial injury.  EKG no acute or ischemic changes, no injury or ectopy.  I think her symptoms are consistent with reflux and  esophagitis secondary to medication use from her tooth.  Given dental referral.  Prilosec, amoxicillin, tramadol.  Avoid NSAIDs.  Final Clinical Impressions(s) / ED Diagnoses   Final diagnoses:  Chest pain, unspecified type  Gastroesophageal reflux disease with esophagitis  Pain, dental    ED Discharge Orders        Ordered    amoxicillin (AMOXIL) 500 MG capsule  3 times daily     07/30/17 1441    traMADol (ULTRAM) 50 MG tablet  Every 6 hours PRN     07/30/17 1441    omeprazole (PRILOSEC) 20 MG capsule  2 times daily     07/30/17 1441       Tanna Furry, MD 07/30/17 1446

## 2017-07-30 NOTE — ED Triage Notes (Signed)
Patient complaining of chest pain x 2 weeks with shortness of breath. Also complaining of pain to bottom left tooth.

## 2017-07-30 NOTE — ED Notes (Signed)
CRITICAL VALUE ALERT  Critical Value:  Troponin 0.04   Date & Time Notied:  07/30/17 1425   Provider Notified: Notified Dr. Jeneen Rinks   Orders Received/Actions taken: No orders yet

## 2017-07-30 NOTE — ED Notes (Signed)
EDP at bedside  

## 2017-07-30 NOTE — Discharge Instructions (Addendum)
Dr. Geralynn Ochs is a dentist in [place].  He can see people based on ability to pay, subsidized by [facility].  Call for appointment.  Tell the staff there you were seen in the ER and referred there.  Call [facility] in [place] for intake appointment.  Also, tell them you are referred from the emergency room.  Ultram or Tylenol for pain.  No other over-the-counter medications.

## 2018-01-22 ENCOUNTER — Emergency Department (HOSPITAL_COMMUNITY)
Admission: EM | Admit: 2018-01-22 | Discharge: 2018-01-22 | Disposition: A | Discharge status: Home / Self Care | Payer: Self-pay | Attending: Emergency Medicine | Admitting: Emergency Medicine

## 2018-01-22 ENCOUNTER — Emergency Department (HOSPITAL_COMMUNITY): Payer: Self-pay

## 2018-01-22 ENCOUNTER — Encounter (HOSPITAL_COMMUNITY): Payer: Self-pay | Admitting: *Deleted

## 2018-01-22 ENCOUNTER — Other Ambulatory Visit: Payer: Self-pay

## 2018-01-22 DIAGNOSIS — S63502A Unspecified sprain of left wrist, initial encounter: Secondary | ICD-10-CM | POA: Insufficient documentation

## 2018-01-22 DIAGNOSIS — S70311A Abrasion, right thigh, initial encounter: Secondary | ICD-10-CM | POA: Insufficient documentation

## 2018-01-22 DIAGNOSIS — Z79899 Other long term (current) drug therapy: Secondary | ICD-10-CM | POA: Insufficient documentation

## 2018-01-22 DIAGNOSIS — Z23 Encounter for immunization: Secondary | ICD-10-CM | POA: Insufficient documentation

## 2018-01-22 DIAGNOSIS — I10 Essential (primary) hypertension: Secondary | ICD-10-CM | POA: Insufficient documentation

## 2018-01-22 DIAGNOSIS — Y939 Activity, unspecified: Secondary | ICD-10-CM | POA: Insufficient documentation

## 2018-01-22 DIAGNOSIS — S7011XA Contusion of right thigh, initial encounter: Secondary | ICD-10-CM | POA: Insufficient documentation

## 2018-01-22 DIAGNOSIS — Y92009 Unspecified place in unspecified non-institutional (private) residence as the place of occurrence of the external cause: Secondary | ICD-10-CM | POA: Insufficient documentation

## 2018-01-22 DIAGNOSIS — Y999 Unspecified external cause status: Secondary | ICD-10-CM | POA: Insufficient documentation

## 2018-01-22 MED ORDER — TETANUS-DIPHTH-ACELL PERTUSSIS 5-2.5-18.5 LF-MCG/0.5 IM SUSP
0.5000 mL | Freq: Once | INTRAMUSCULAR | Status: AC
  Administered 2018-01-22: 0.5 mL via INTRAMUSCULAR
  Filled 2018-01-22: qty 0.5

## 2018-01-22 MED ORDER — METHOCARBAMOL 500 MG PO TABS: ORAL_TABLET | ORAL | 0 refills | Status: DC

## 2018-01-22 MED ORDER — ACETAMINOPHEN 500 MG PO TABS
1000.0000 mg | ORAL_TABLET | Freq: Once | ORAL | Status: AC
  Administered 2018-01-22: 1000 mg via ORAL
  Filled 2018-01-22: qty 2
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
## 2018-01-22 NOTE — Discharge Instructions (Addendum)
Keep the abrasion clean and dry. Do clean it daily with a soapy wash cloth. You can put triple antibiotic ointment on it to prevent infection. Use ice packs on the bruised area. Take acetaminophen 1000 mg 3-4 times a day for pain as needed. You can also try the muscle relaxer for the bruising pain.   Recheck if you get a fever, the wounds gets infected or if you are still painful in your wrist in another week. Call Dr Aline Brochure, the orthopedist on call, to get an appointment.
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
## 2018-01-27 ENCOUNTER — Emergency Department (HOSPITAL_COMMUNITY)
Admission: EM | Admit: 2018-01-27 | Discharge: 2018-01-27 | Disposition: A | Discharge status: Home / Self Care | Payer: Self-pay | Attending: Emergency Medicine | Admitting: Emergency Medicine

## 2018-01-27 ENCOUNTER — Encounter (HOSPITAL_COMMUNITY): Payer: Self-pay | Admitting: Emergency Medicine

## 2018-01-27 DIAGNOSIS — F419 Anxiety disorder, unspecified: Secondary | ICD-10-CM

## 2018-01-27 DIAGNOSIS — Z79899 Other long term (current) drug therapy: Secondary | ICD-10-CM | POA: Insufficient documentation

## 2018-01-27 DIAGNOSIS — F4310 Post-traumatic stress disorder, unspecified: Secondary | ICD-10-CM | POA: Insufficient documentation

## 2018-01-27 DIAGNOSIS — T7411XA Adult physical abuse, confirmed, initial encounter: Secondary | ICD-10-CM | POA: Insufficient documentation

## 2018-01-27 DIAGNOSIS — I10 Essential (primary) hypertension: Secondary | ICD-10-CM | POA: Insufficient documentation

## 2018-01-27 DIAGNOSIS — F329 Major depressive disorder, single episode, unspecified: Secondary | ICD-10-CM | POA: Insufficient documentation

## 2018-01-27 DIAGNOSIS — F32A Depression, unspecified: Secondary | ICD-10-CM

## 2018-01-27 LAB — BASIC METABOLIC PANEL
ANION GAP: 8 (ref 5–15)
BUN: 11 mg/dL (ref 6–20)
CALCIUM: 8.8 mg/dL — AB (ref 8.9–10.3)
CHLORIDE: 104 mmol/L (ref 98–111)
CO2: 25 mmol/L (ref 22–32)
Creatinine, Ser: 0.85 mg/dL (ref 0.44–1.00)
GFR calc Af Amer: >60 mL/min (ref >60)
GFR calc non Af Amer: >60 mL/min (ref >60)
Glucose, Bld: 83 mg/dL (ref 70–99)
POTASSIUM: 3.1 mmol/L — AB (ref 3.5–5.1)
Sodium: 137 mmol/L (ref 135–145)

## 2018-01-27 LAB — CBC WITH DIFFERENTIAL/PLATELET
ABS IMMATURE GRANULOCYTES: 0.01 10*3/uL (ref 0.00–0.07)
Basophils Absolute: 0 10*3/uL (ref 0.0–0.1)
Basophils Relative: 1 %
Eosinophils Absolute: 0.1 10*3/uL (ref 0.0–0.5)
Eosinophils Relative: 1 %
HCT: 37.6 % (ref 36.0–46.0)
HEMOGLOBIN: 11.5 g/dL — AB (ref 12.0–15.0)
Immature Granulocytes: 0 %
LYMPHS ABS: 1.1 10*3/uL (ref 0.7–4.0)
LYMPHS PCT: 27 %
MCH: 26.2 pg (ref 26.0–34.0)
MCHC: 30.6 g/dL (ref 30.0–36.0)
MCV: 85.6 fL (ref 80.0–100.0)
MONOS PCT: 9 %
Monocytes Absolute: 0.3 10*3/uL (ref 0.1–1.0)
NEUTROS ABS: 2.5 10*3/uL (ref 1.7–7.7)
Neutrophils Relative %: 62 %
Platelets: 179 10*3/uL (ref 150–400)
RBC: 4.39 MIL/uL (ref 3.87–5.11)
RDW: 13.8 % (ref 11.5–15.5)
WBC: 3.9 10*3/uL — ABNORMAL LOW (ref 4.0–10.5)
nRBC: 0 % (ref 0.0–0.2)

## 2018-01-27 LAB — RAPID URINE DRUG SCREEN, HOSP PERFORMED
AMPHETAMINES: NOT DETECTED
Barbiturates: NOT DETECTED
Benzodiazepines: POSITIVE — AB
Cocaine: NOT DETECTED
OPIATES: NOT DETECTED
TETRAHYDROCANNABINOL: NOT DETECTED

## 2018-01-27 LAB — ETHANOL: Alcohol, Ethyl (B): <10 mg/dL (ref <10)
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
## 2018-01-27 NOTE — ED Notes (Signed)
TTS placed at bedside.  
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
## 2018-01-27 NOTE — Discharge Instructions (Signed)
Follow-up with community mental health resources °

## 2018-01-29 ENCOUNTER — Emergency Department (HOSPITAL_COMMUNITY)
Admission: EM | Admit: 2018-01-29 | Discharge: 2018-01-29 | Disposition: A | Discharge status: Home / Self Care | Payer: Self-pay | Attending: Emergency Medicine | Admitting: Emergency Medicine

## 2018-01-29 ENCOUNTER — Other Ambulatory Visit: Payer: Self-pay

## 2018-01-29 ENCOUNTER — Encounter (HOSPITAL_COMMUNITY): Payer: Self-pay | Admitting: Emergency Medicine

## 2018-01-29 DIAGNOSIS — Z79899 Other long term (current) drug therapy: Secondary | ICD-10-CM | POA: Insufficient documentation

## 2018-01-29 DIAGNOSIS — I10 Essential (primary) hypertension: Secondary | ICD-10-CM | POA: Insufficient documentation

## 2018-01-29 DIAGNOSIS — B372 Candidiasis of skin and nail: Secondary | ICD-10-CM | POA: Insufficient documentation

## 2018-01-29 DIAGNOSIS — F419 Anxiety disorder, unspecified: Secondary | ICD-10-CM | POA: Insufficient documentation

## 2018-01-29 DIAGNOSIS — R0789 Other chest pain: Secondary | ICD-10-CM | POA: Insufficient documentation

## 2018-01-29 NOTE — Discharge Instructions (Addendum)
Take the medicines you have been given for the skin and urinary tract infection.  Follow-up with your doctors as planned.
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
## 2018-01-29 NOTE — ED Triage Notes (Signed)
Pt c/o mid sternal sharp pains x 2 weeks. Here 2 weeks ago for same. Nad. Non diaphoretic. Pt c/o lower abd pain from partial hysterctomy last year.

## 2018-03-06 DIAGNOSIS — Z139 Encounter for screening, unspecified: Secondary | ICD-10-CM

## 2018-03-06 LAB — GLUCOSE, POCT (MANUAL RESULT ENTRY): POC GLUCOSE: 78 mg/dL (ref 70–99)
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
## 2018-03-18 ENCOUNTER — Encounter: Payer: Self-pay | Admitting: Physician Assistant

## 2018-03-18 ENCOUNTER — Ambulatory Visit: Payer: Self-pay | Admitting: Physician Assistant

## 2018-03-18 VITALS — BP 159/86 | HR 84 | Temp 98.1°F | Ht 62.5 in | Wt 206.8 lb

## 2018-03-18 DIAGNOSIS — Z1239 Encounter for other screening for malignant neoplasm of breast: Secondary | ICD-10-CM

## 2018-03-18 DIAGNOSIS — Z7689 Persons encountering health services in other specified circumstances: Secondary | ICD-10-CM

## 2018-03-18 DIAGNOSIS — Z13220 Encounter for screening for lipoid disorders: Secondary | ICD-10-CM

## 2018-03-18 DIAGNOSIS — R198 Other specified symptoms and signs involving the digestive system and abdomen: Secondary | ICD-10-CM

## 2018-03-18 DIAGNOSIS — K219 Gastro-esophageal reflux disease without esophagitis: Secondary | ICD-10-CM

## 2018-03-18 DIAGNOSIS — E876 Hypokalemia: Secondary | ICD-10-CM

## 2018-03-18 DIAGNOSIS — D171 Benign lipomatous neoplasm of skin and subcutaneous tissue of trunk: Secondary | ICD-10-CM

## 2018-03-18 DIAGNOSIS — K649 Unspecified hemorrhoids: Secondary | ICD-10-CM

## 2018-03-18 DIAGNOSIS — Z86010 Personal history of colonic polyps: Secondary | ICD-10-CM

## 2018-03-18 DIAGNOSIS — E669 Obesity, unspecified: Secondary | ICD-10-CM

## 2018-03-18 DIAGNOSIS — R0601 Orthopnea: Secondary | ICD-10-CM

## 2018-03-18 DIAGNOSIS — K029 Dental caries, unspecified: Secondary | ICD-10-CM

## 2018-03-18 DIAGNOSIS — R079 Chest pain, unspecified: Secondary | ICD-10-CM

## 2018-03-18 DIAGNOSIS — D649 Anemia, unspecified: Secondary | ICD-10-CM

## 2018-03-18 DIAGNOSIS — R011 Cardiac murmur, unspecified: Secondary | ICD-10-CM

## 2018-03-18 DIAGNOSIS — F419 Anxiety disorder, unspecified: Secondary | ICD-10-CM

## 2018-03-18 DIAGNOSIS — K0889 Other specified disorders of teeth and supporting structures: Secondary | ICD-10-CM

## 2018-03-18 DIAGNOSIS — Z131 Encounter for screening for diabetes mellitus: Secondary | ICD-10-CM

## 2018-03-18 DIAGNOSIS — I10 Essential (primary) hypertension: Secondary | ICD-10-CM

## 2018-03-18 MED ORDER — OMEPRAZOLE 40 MG PO CPDR: 40.0000 mg | DELAYED_RELEASE_CAPSULE | Freq: Every day | ORAL | 3 refills | Status: DC

## 2018-03-18 MED ORDER — AMLODIPINE BESYLATE 10 MG PO TABS: 10.0000 mg | ORAL_TABLET | Freq: Every day | ORAL | 0 refills | Status: AC

## 2018-03-18 MED ORDER — METOPROLOL TARTRATE 50 MG PO TABS: 50.0000 mg | ORAL_TABLET | Freq: Two times a day (BID) | ORAL | 1 refills | Status: DC

## 2018-03-18 MED ORDER — AMLODIPINE BESYLATE 10 MG PO TABS: 10.0000 mg | ORAL_TABLET | Freq: Every day | ORAL | 1 refills | Status: DC

## 2018-03-18 MED ORDER — OMEPRAZOLE 40 MG PO CPDR: 40.0000 mg | DELAYED_RELEASE_CAPSULE | Freq: Every day | ORAL | 0 refills | Status: AC
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
## 2018-03-19 ENCOUNTER — Other Ambulatory Visit (HOSPITAL_COMMUNITY)
Admission: RE | Admit: 2018-03-19 | Discharge: 2018-03-19 | Disposition: A | Discharge status: Home / Self Care | Payer: Self-pay | Source: Ambulatory Visit | Attending: Physician Assistant | Admitting: Physician Assistant

## 2018-03-19 DIAGNOSIS — Z131 Encounter for screening for diabetes mellitus: Secondary | ICD-10-CM

## 2018-03-19 DIAGNOSIS — Z13220 Encounter for screening for lipoid disorders: Secondary | ICD-10-CM

## 2018-03-19 DIAGNOSIS — R079 Chest pain, unspecified: Secondary | ICD-10-CM

## 2018-03-19 DIAGNOSIS — E876 Hypokalemia: Secondary | ICD-10-CM

## 2018-03-19 DIAGNOSIS — I10 Essential (primary) hypertension: Secondary | ICD-10-CM

## 2018-03-19 DIAGNOSIS — R0601 Orthopnea: Secondary | ICD-10-CM

## 2018-03-19 DIAGNOSIS — D649 Anemia, unspecified: Secondary | ICD-10-CM

## 2018-03-19 LAB — LIPID PANEL
Cholesterol: 163 mg/dL (ref 0–200)
HDL: 74 mg/dL (ref >40)
LDL CALC: 76 mg/dL (ref 0–99)
TRIGLYCERIDES: 65 mg/dL (ref <150)
Total CHOL/HDL Ratio: 2.2 RATIO
VLDL: 13 mg/dL (ref 0–40)

## 2018-03-19 LAB — HEMOGLOBIN A1C
HEMOGLOBIN A1C: 5.4 % (ref 4.8–5.6)
MEAN PLASMA GLUCOSE: 108.28 mg/dL

## 2018-03-19 LAB — BRAIN NATRIURETIC PEPTIDE: B Natriuretic Peptide: 21 pg/mL (ref 0.0–100.0)

## 2018-03-19 LAB — COMPREHENSIVE METABOLIC PANEL
ALT: 19 U/L (ref 0–44)
AST: 21 U/L (ref 15–41)
Albumin: 3.7 g/dL (ref 3.5–5.0)
Alkaline Phosphatase: 97 U/L (ref 38–126)
Anion gap: 8 (ref 5–15)
BUN: 14 mg/dL (ref 6–20)
CHLORIDE: 106 mmol/L (ref 98–111)
CO2: 22 mmol/L (ref 22–32)
Calcium: 8.9 mg/dL (ref 8.9–10.3)
Creatinine, Ser: 0.59 mg/dL (ref 0.44–1.00)
Glucose, Bld: 92 mg/dL (ref 70–99)
POTASSIUM: 3.6 mmol/L (ref 3.5–5.1)
Sodium: 136 mmol/L (ref 135–145)
Total Bilirubin: 0.2 mg/dL — ABNORMAL LOW (ref 0.3–1.2)
Total Protein: 7.6 g/dL (ref 6.5–8.1)

## 2018-03-19 LAB — HEMOGLOBIN AND HEMATOCRIT, BLOOD
HEMATOCRIT: 36.9 % (ref 36.0–46.0)
HEMOGLOBIN: 11.5 g/dL — AB (ref 12.0–15.0)

## 2018-03-25 ENCOUNTER — Telehealth (HOSPITAL_COMMUNITY): Payer: Self-pay

## 2018-03-25 NOTE — Telephone Encounter (Signed)
Left message with patient that I was calling regarding the mammo scholarship application that she filled out and that she can call us back to get scheduled for that appointment.

## 2018-04-04 ENCOUNTER — Ambulatory Visit (INDEPENDENT_AMBULATORY_CARE_PROVIDER_SITE_OTHER): Payer: Self-pay | Admitting: Internal Medicine

## 2018-04-04 ENCOUNTER — Encounter (INDEPENDENT_AMBULATORY_CARE_PROVIDER_SITE_OTHER): Payer: Self-pay | Admitting: Internal Medicine

## 2018-04-04 ENCOUNTER — Telehealth: Payer: Self-pay

## 2018-04-04 ENCOUNTER — Other Ambulatory Visit: Payer: Self-pay | Admitting: Physician Assistant

## 2018-04-04 ENCOUNTER — Encounter (INDEPENDENT_AMBULATORY_CARE_PROVIDER_SITE_OTHER): Payer: Self-pay | Admitting: *Deleted

## 2018-04-04 VITALS — BP 164/85 | HR 86 | Temp 98.1°F | Ht 63.0 in | Wt 206.8 lb

## 2018-04-04 DIAGNOSIS — K625 Hemorrhage of anus and rectum: Secondary | ICD-10-CM

## 2018-04-04 DIAGNOSIS — R198 Other specified symptoms and signs involving the digestive system and abdomen: Secondary | ICD-10-CM | POA: Insufficient documentation

## 2018-04-04 MED ORDER — METOPROLOL TARTRATE 50 MG PO TABS: 50.0000 mg | ORAL_TABLET | Freq: Two times a day (BID) | ORAL | 0 refills | Status: DC

## 2018-04-04 NOTE — Patient Instructions (Signed)
The risks of bleeding, perforation and infection were reviewed with patient.  
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
## 2018-04-04 NOTE — Telephone Encounter (Signed)
Called [facility] and spoke with [facility] LPN regarding client. On [facility]'s [facility] the provider sent prescription for Metoprolol 50 mg to [employer] on 03/18/18. Client was employed at that time, but has since been laid off and cannot purchase this medication. Inquired with LPN to consult provider regarding sending medication to [facility] to be filled. In the meantime, [facility] will request a transfer of that mediation from [place] to [place] and help with a one time assistance filling that medication.   Assistance voucher faxed to [employer] and confirmed that it was received.  Client encouraged to pick up medication and to begin as directed. Counseled client to keep her follow up visit with [facility] on 04/15/18. Client states understanding.  Will follow as needed.  Debria Garret RN

## 2018-04-05 ENCOUNTER — Ambulatory Visit (HOSPITAL_COMMUNITY)
Admission: RE | Admit: 2018-04-05 | Discharge: 2018-04-05 | Disposition: A | Discharge status: Home / Self Care | Payer: Self-pay | Source: Ambulatory Visit | Attending: Physician Assistant | Admitting: Physician Assistant

## 2018-04-05 DIAGNOSIS — R011 Cardiac murmur, unspecified: Secondary | ICD-10-CM

## 2018-04-05 DIAGNOSIS — R079 Chest pain, unspecified: Secondary | ICD-10-CM

## 2018-04-05 DIAGNOSIS — I10 Essential (primary) hypertension: Secondary | ICD-10-CM

## 2018-04-05 DIAGNOSIS — R0601 Orthopnea: Secondary | ICD-10-CM

## 2018-04-05 NOTE — Progress Notes (Signed)
*  PRELIMINARY RESULTS* Echocardiogram 2D Echocardiogram has been performed.  Madison Gilbert 04/05/2018, 3:39 PM

## 2018-04-15 ENCOUNTER — Ambulatory Visit: Payer: Self-pay | Admitting: Physician Assistant

## 2018-04-15 ENCOUNTER — Encounter: Payer: Self-pay | Admitting: Physician Assistant

## 2018-04-15 VITALS — BP 167/94 | HR 84 | Temp 97.7°F | Ht 63.0 in | Wt 211.5 lb

## 2018-04-15 DIAGNOSIS — F419 Anxiety disorder, unspecified: Secondary | ICD-10-CM

## 2018-04-15 DIAGNOSIS — D649 Anemia, unspecified: Secondary | ICD-10-CM

## 2018-04-15 DIAGNOSIS — R198 Other specified symptoms and signs involving the digestive system and abdomen: Secondary | ICD-10-CM

## 2018-04-15 DIAGNOSIS — R079 Chest pain, unspecified: Secondary | ICD-10-CM

## 2018-04-15 DIAGNOSIS — I10 Essential (primary) hypertension: Secondary | ICD-10-CM

## 2018-04-15 MED ORDER — METOPROLOL TARTRATE 100 MG PO TABS: 100.0000 mg | ORAL_TABLET | Freq: Two times a day (BID) | ORAL | 1 refills | Status: AC
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
## 2018-04-29 ENCOUNTER — Encounter: Payer: Self-pay | Admitting: Cardiovascular Disease

## 2018-05-06 ENCOUNTER — Ambulatory Visit (HOSPITAL_COMMUNITY): Admission: RE | Admit: 2018-05-06 | Payer: Self-pay | Source: Home / Self Care | Admitting: Internal Medicine

## 2018-05-06 ENCOUNTER — Encounter (HOSPITAL_COMMUNITY): Admission: RE | Payer: Self-pay | Source: Home / Self Care

## 2018-05-06 SURGERY — COLONOSCOPY
Anesthesia: Moderate Sedation

## 2018-05-13 ENCOUNTER — Ambulatory Visit: Payer: Self-pay | Admitting: Physician Assistant

## 2018-05-20 ENCOUNTER — Other Ambulatory Visit: Payer: Self-pay | Admitting: Physician Assistant

## 2018-05-20 DIAGNOSIS — Z1239 Encounter for other screening for malignant neoplasm of breast: Secondary | ICD-10-CM

## 2018-05-23 ENCOUNTER — Encounter: Payer: Self-pay | Admitting: Physician Assistant

## 2018-06-03 ENCOUNTER — Ambulatory Visit (HOSPITAL_COMMUNITY): Payer: Self-pay

## 2018-06-03 ENCOUNTER — Encounter (HOSPITAL_COMMUNITY): Payer: Self-pay

## 2019-02-02 ENCOUNTER — Other Ambulatory Visit: Payer: Self-pay | Admitting: Physician Assistant
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
